# Patient Record
Sex: Female | Born: 1945 | Race: White | Hispanic: No | Smoking: Former smoker
Health system: Southern US, Community
[De-identification: ages and names within clinical notes are randomized; demographics above are authoritative.]

## PROBLEM LIST (undated history)

## (undated) DIAGNOSIS — E785 Hyperlipidemia, unspecified: Secondary | ICD-10-CM

## (undated) DIAGNOSIS — I350 Nonrheumatic aortic (valve) stenosis: Secondary | ICD-10-CM

## (undated) DIAGNOSIS — J302 Other seasonal allergic rhinitis: Secondary | ICD-10-CM

## (undated) DIAGNOSIS — R42 Dizziness and giddiness: Secondary | ICD-10-CM

## (undated) DIAGNOSIS — S46219A Strain of muscle, fascia and tendon of other parts of biceps, unspecified arm, initial encounter: Secondary | ICD-10-CM

## (undated) DIAGNOSIS — E215 Disorder of parathyroid gland, unspecified: Secondary | ICD-10-CM

## (undated) DIAGNOSIS — I10 Essential (primary) hypertension: Secondary | ICD-10-CM

## (undated) HISTORY — PX: ABDOMINAL SURGERY: SHX537

## (undated) HISTORY — DX: Essential (primary) hypertension: I10

## (undated) HISTORY — DX: Strain of muscle, fascia and tendon of other parts of biceps, unspecified arm, initial encounter: S46.219A

## (undated) HISTORY — DX: Hyperlipidemia, unspecified: E78.5

## (undated) HISTORY — DX: Other seasonal allergic rhinitis: J30.2

## (undated) HISTORY — DX: Dizziness and giddiness: R42

## (undated) HISTORY — PX: BREAST BIOPSY: SHX20

---

## 1975-10-25 HISTORY — PX: ABDOMINAL HYSTERECTOMY: SHX81

## 1997-10-24 HISTORY — PX: CHOLECYSTECTOMY: SHX55

## 1999-05-02 ENCOUNTER — Inpatient Hospital Stay (HOSPITAL_COMMUNITY): Admission: EM | Admit: 1999-05-02 | Discharge: 1999-05-05 | Payer: Self-pay | Admitting: Emergency Medicine

## 1999-05-02 ENCOUNTER — Encounter: Payer: Self-pay | Admitting: General Surgery

## 1999-05-02 ENCOUNTER — Encounter: Payer: Self-pay | Admitting: Emergency Medicine

## 1999-05-05 ENCOUNTER — Encounter: Payer: Self-pay | Admitting: General Surgery

## 2005-05-30 ENCOUNTER — Other Ambulatory Visit: Admission: RE | Admit: 2005-05-30 | Discharge: 2005-05-30 | Payer: Self-pay | Admitting: Family Medicine

## 2005-06-06 ENCOUNTER — Ambulatory Visit (HOSPITAL_COMMUNITY): Admission: RE | Admit: 2005-06-06 | Discharge: 2005-06-06 | Payer: Self-pay | Admitting: Surgery

## 2005-06-06 ENCOUNTER — Ambulatory Visit (HOSPITAL_BASED_OUTPATIENT_CLINIC_OR_DEPARTMENT_OTHER): Admission: RE | Admit: 2005-06-06 | Discharge: 2005-06-06 | Payer: Self-pay | Admitting: Surgery

## 2005-06-06 ENCOUNTER — Encounter (INDEPENDENT_AMBULATORY_CARE_PROVIDER_SITE_OTHER): Payer: Self-pay | Admitting: Specialist

## 2007-01-19 ENCOUNTER — Ambulatory Visit (HOSPITAL_BASED_OUTPATIENT_CLINIC_OR_DEPARTMENT_OTHER): Admission: RE | Admit: 2007-01-19 | Discharge: 2007-01-19 | Payer: Self-pay | Admitting: Orthopaedic Surgery

## 2007-10-25 HISTORY — PX: REPLACEMENT TOTAL KNEE: SUR1224

## 2008-05-07 ENCOUNTER — Inpatient Hospital Stay (HOSPITAL_COMMUNITY): Admission: RE | Admit: 2008-05-07 | Discharge: 2008-05-12 | Payer: Self-pay | Admitting: Orthopedic Surgery

## 2008-09-24 ENCOUNTER — Ambulatory Visit (HOSPITAL_BASED_OUTPATIENT_CLINIC_OR_DEPARTMENT_OTHER): Admission: RE | Admit: 2008-09-24 | Discharge: 2008-09-24 | Payer: Self-pay | Admitting: Orthopedic Surgery

## 2008-12-04 IMAGING — CR DG CHEST 2V
2 series · 2 of 2 positions shown · non-contrast
Comparison: None

CLINICAL DATA: Preop.

CHEST - 2 VIEW

[view not recorded (1 of 2)]
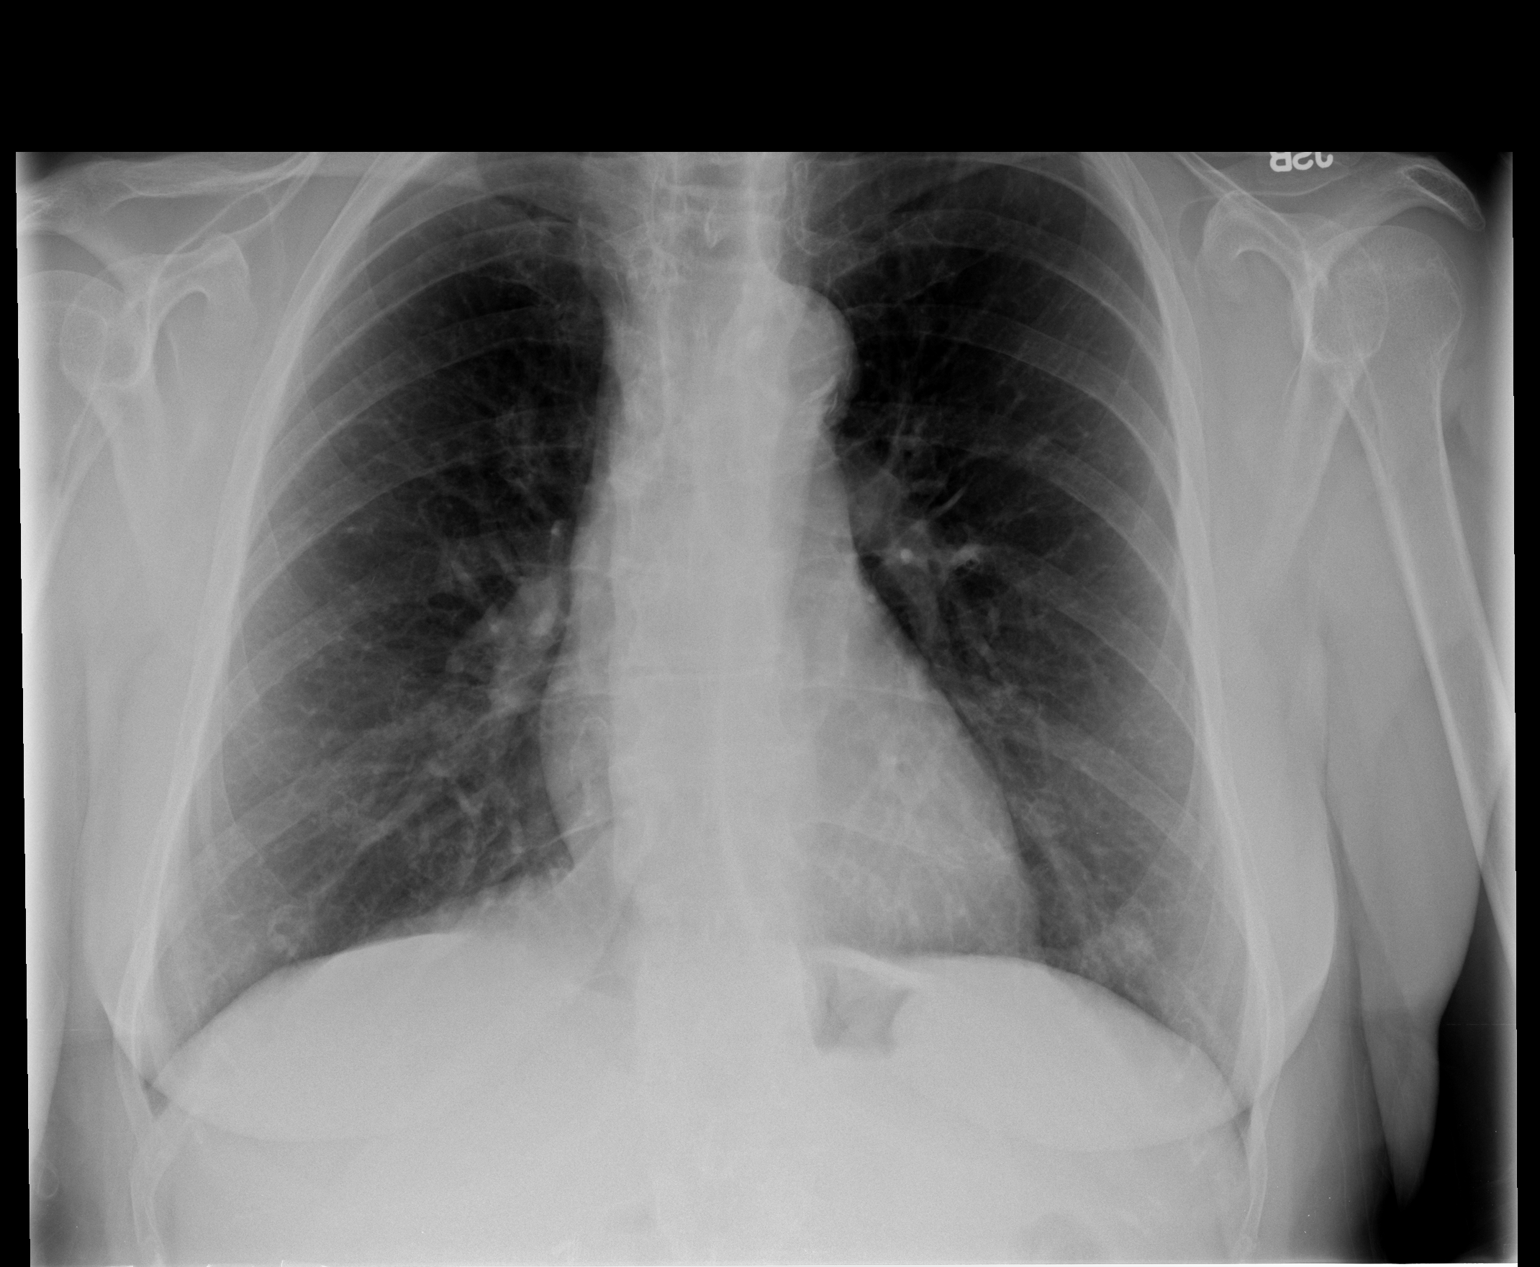

[view not recorded (2 of 2)]
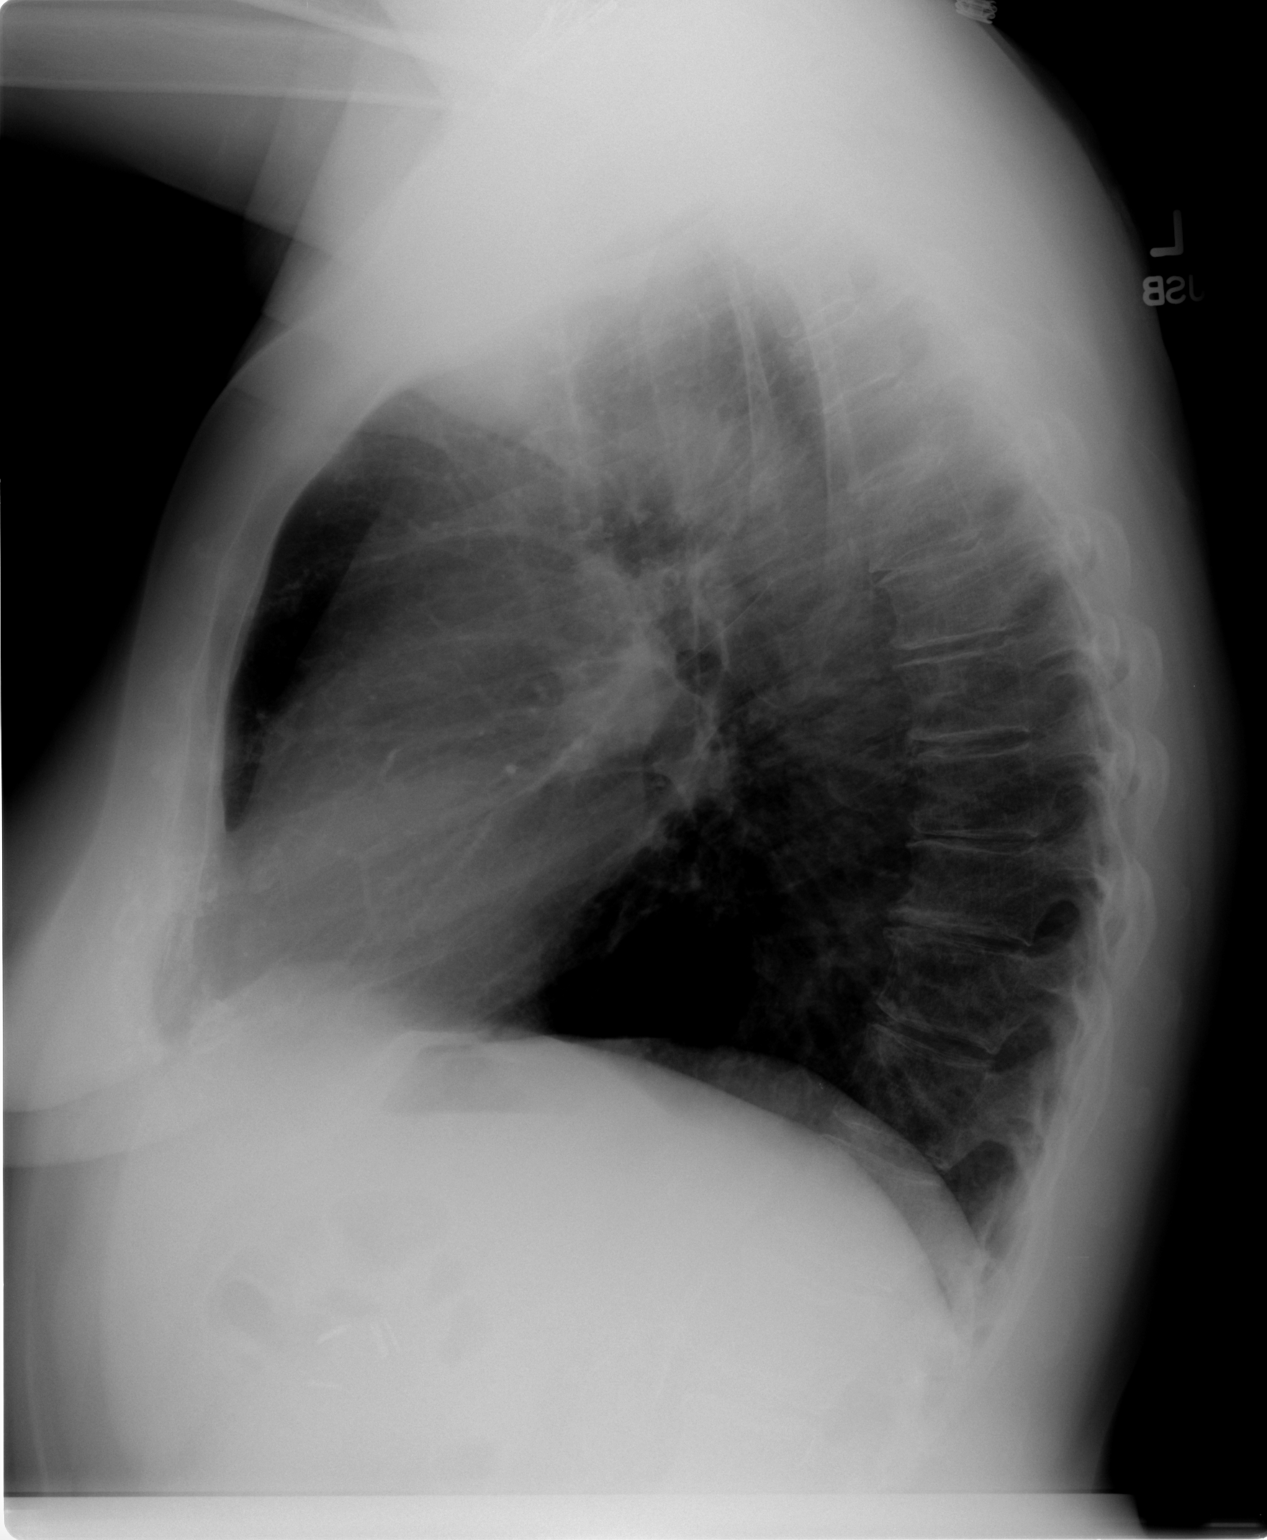

[2 of 2 positions shown; findings below may reference images not displayed]

FINDINGS: Trachea is midline.  Heart size normal.  Thoracic aorta
is calcified.  Probable summation shadow and/or nipple shadow in
the lower left hemithorax.  Lungs otherwise clear.  No pleural
fluid.  Degenerative changes are seen in the spine.
IMPRESSION: No acute findings.

## 2011-03-08 NOTE — Op Note (Signed)
NAME:  Margaret [name], Margaret [name]              ACCOUNT NO.:  [id number]   MEDICAL RECORD NO.:  [id number]          PATIENT TYPE:  AMB   LOCATION:  DSC                          FACILITY:  [facility]   PHYSICIAN:  Harvie Junior, M.D.   DATE OF BIRTH:  02-Oct-1946   DATE OF PROCEDURE:  09/24/2008  DATE OF DISCHARGE:                               OPERATIVE REPORT   PREOPERATIVE DIAGNOSES:  Painful left shoulder with impingement and  acromioclavicular joint arthritis.   POSTOPERATIVE DIAGNOSES:  1. Subscapularis tendon tear.  2. Ruptured biceps tendon.  3. Impingement.  4. Acromioclavicular joint arthritis.   PRINCIPAL PROCEDURES:  1. Mini-open rotator cuff repair of the subscapularis with a 5.5      anchor anteriorly.  2. Arthroscopic subacromial decompression.  3. Arthroscopic distal clavicle resection of 18 mm.  4. Open biceps tenodesis.  5. Debridement of the stump of the biceps tendon within the      glenohumeral joint.   SURGEON:  Harvie Junior, MD.   ASSISTANT:  None.   ANESTHESIA:  General.   BRIEF HISTORY:  Margaret [name] is a 65 year old female with a long history of  having had significant painful left shoulder.  She had been treated for  about a year.  She had injections which did help, but her pain was just  persistent.  She was brought to the operating room for fixation of this  procedure.   PROCEDURE:  The patient was brought to the operating room.  After  adequate anesthesia was obtained with general anesthetic, the patient  placed in the supine position and then he was moved to the beach chair  position.  All bony prominences were well padded.  A preoperative  interscalene block had been performed at this point.  After routine prep  and drape, arthroscopic examination of the shoulder revealed there was  an obvious biceps tendon rupture and the stump was just flapping free in  the joint.  It was about probably 20 mm in the joint.  We used a  debrider and debrided this down.   Once we debrided this down back to the  superior labrum, we looked anteriorly, and unfortunately the subscap  tendon had ruptured and it had been retracted back and this was just  anterior to the pole of the biceps tendon and the interval there.  Once  this was evaluated, the supraspintaus and infraspinatus were evaluated  and felt to have some partial thickness tearing.  This was debrided  undersurface, but no full thickness tearing.  Attention was turned out  to the glenohumeral joint in the subacromial space where an  anterolateral acromioplasty was performed, distal clavicle resection was  performed, and the rotator cuff was evaluated from the top side.  The  fray and tear of the subscap was identified from the top side and at  this point, the arthroscopic portion of the case was abandoned and a  small incision was made over the anterior portal slightly anterior to  where we typically go, so we could get to the subscap dissected down to  the biceps tendon interval, made  a small rend in the ligament holding  the biceps tendon in place and put a tagging stitch on the biceps.  We  then went anterior to the biceps and got the subscap, we found the torn  area, debrided the bone thoroughly with a rongeur and once this was  fashioned up well a 5.5 anchor was placed.  Stitches were placed in the  anterior leading edges of the subscap and that was tacked down the bone  in this area.  Attention was then turned to the biceps tendon where a  curette was used to roughen up the bicipital groove and then following  this a 5.5 anchor was placed in the bicipital groove and with the arm in  extension the biceps tendon was locked in place with the sutures #2  FiberWire, they were on the 5.5 corkscrew anchor.  Once the biceps  tenodesis was performed, we then evaluated.  The acromioplasty was  excellently performed.  At this point, the deltoid rend was closed with  a 1 Vicryl running, skin with 0 and 2-0  Vicryl and 3-0 Monocryl  subcuticular stitch.  Benzoin and Steri-Strips were applied.  The  patient was taken to the recovery room and was noted to be in  satisfactory condition.  Estimated blood loss for procedure was less  than 25 mL.      Harvie Junior, M.D.  Electronically Signed     JLG/MEDQ  D:  09/24/2008  T:  09/24/2008  Job:  [id number]

## 2011-03-08 NOTE — Op Note (Signed)
NAME:  RUARI, DUGGAN NO.:  [id number]   MEDICAL RECORD NO.:  [id number]          PATIENT TYPE:  INP   LOCATION:  5003                         FACILITY:  [facility]   PHYSICIAN:  Harvie Junior, M.D.   DATE OF BIRTH:  1946-01-20   DATE OF PROCEDURE:  05/07/2008  DATE OF DISCHARGE:                               OPERATIVE REPORT   PREOPERATIVE DIAGNOSIS:  End-stage degenerative joint disease bilateral  knees.   POSTOPERATIVE DIAGNOSIS:  End-stage degenerative joint disease bilateral  knees.   PROCEDURES:  1. Right total knee replacement with a Sigma System size 4 narrow      femur, size 3 tibia, 38-mm all poly patella and a 12.5-mm bridging      bearing.   1. Computer-assisted right total knee replacement.   SURGEON:  Harvie Junior, MD   ASSISTANT:  Marshia Ly, PA   ANESTHESIA:  General.   BRIEF HISTORY:  Ms. Ressler is a 65 year old female with a long history of  having severe and significant bilateral knee pain.  We treated  conservatively for a period of time with activity modification,  injection therapy, anti-inflammatory medication, and use of a cane.  She  failed all of these.  Subsequently, plans were made and she was taken to  the operating room for right total knee replacement.  Because of her  young age and longevity, the family felt that computer-assisted  alignment would be appropriate and this was chosen to be used in  preoperatively.   PROCEDURE:  The patient was taken to the operating room and after  anesthesia was obtained with general anesthetic, the patient was placed  supine on the operating table.  The right leg was prepped and draped in  the usual sterile fashion.  Following this, the leg was exsanguinated  and blood pressure tourniquet was inflated to 350 mmHg.  Following this,  a midline incision was made in subcutaneous tissues extending down the  level of the extensor mechanism.  Once this was identified, the medial  and lateral  meniscectomy were performed, as well as anterior and  posterior cruciate excision and osteophyte excision.  Retropatellar fat  pad was excised.  Once this was completed, attention was turned towards  putting into computer assistance modules plus two pins in the tibia and  two pins in the femur and following this, the rays were placed about the  tibia and the femur.  Following this, a registration process was  undertaken at about 20 minutes of the surgical procedure.  The knee was  then put through a range of motion and a long alignment was identified.  Once this was completed, the tibia was then cut perpendicular to its  long axis under computer assisted alignment.  The femur was then cut  perpendicular to the anatomic axis under computer assisted alignment.  Once this was complete, attention was turned towards putting in the  spacer blocks and we could put in the spacer blocks easily once a little  additional tibia was cut again under computer assistance.  Once this was  completed, attention was turned towards sizing  the tibia.  We then sized  with a old school sizing guide and rotated appropriately.  Once this was  done, the tibial cutting block was used.  Once this was completed,  attention was turned towards the anterior and posterior cuts, as well as  the chamfer cuts and once this was completed, attention turned to the  box cut which was completed.  Attention turned to the tibia which was  sized to a 3 and it was drilled and keeled.  Once this was completed,  attention was turned towards the medial lateral side where the 12.5 poly  was put in place and computer assistance was then used perfect neutral  long alignment and perfect gap balance in both flexion and extension.  Attention then turned to the patella which was sized and then cut back  to a level of 13 and then a 38.  The lug holes were drilled for 38 all  poly patella.  These trial components were then placed initially a  size  4 femur and then we went down to the four narrow size 3 tibia, 12.5 mm  bridging-bearing and a 38-mm all poly trial and the excellent neutral  long alignment gap balances were achieved.  The trial components were  removed at this point and the final components were then cemented into  place after copious and thorough irrigation of the knee joint.  Once  this was completed, the attention was turned towards the computer  alignment.  Once again after the trial components were cemented into  place perfect neutral long alignment, the cement was allowed to harden.  The tourniquet was then let down.  All bleeders controlled with  electrocautery and the final poly was then put in place and checked  again under computer assistance.  Computer assistance was then remove at  this point and the final alignment was checked, range of motion was  checked and stability.  All excellent at this point and the medial  parapatellar arthrotomy was closed with 1 Vicryl running over a medium  Hemovac drain.  The skin was closed with 0 and 2-0 Vicryl and 3-0  Monocryl subcuticular stitching.  Benzoin and Steri-Strips were applied.  Sterile compressive dressing was applied.  The patient taken to the  recovery room.  She was noted to be in satisfactory condition.   ESTIMATED BLOOD LOSS:  Less than 100 mL.      Harvie Junior, M.D.  Electronically Signed     JLG/MEDQ  D:  05/07/2008  T:  05/08/2008  Job:  [id number]

## 2011-03-11 NOTE — Op Note (Signed)
NAME:  Margaret [name], Margaret [name]              ACCOUNT NO.:  [id number]   MEDICAL RECORD NO.:  [id number]          PATIENT TYPE:  AMB   LOCATION:  N[facility]C                         [facility]   PHYSICIAN:  Thornton Park. Daphine Deutscher, MD  DATE OF BIRTH:  1946-05-22   DATE OF PROCEDURE:  06/06/2005  DATE OF DISCHARGE:                                 OPERATIVE REPORT   PREOPERATIVE DIAGNOSES:  Mass of back.   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   SURGEON:  Luretha Murphy, M.D.   ANESTHESIA:  MAC.   DESCRIPTION OF PROCEDURE:  Ms. Ausley was given MAC anesthesia and placed in  the left lateral decubitus position. I preoperatively marked the area. I  infiltrated with 1% lidocaine and made a transverse incision through the  skin and were upon I found it encased in some connective tissue. I went  ahead and cut down through this and was able to tease this away from its  adhesions and removed it in toto. The Bovie was used to cauterize the base.  I then sent this for permanent sections. I closed the wound in layers with  the deepest layer taking purchases of the fascia __________ tack the skin  back down to the fascia and to obliterate the dead space. 4-0 Vicryl  subcuticular was used with Benzoin and Steri-Strips to complete the closure.  The patient will be offered some Vicodin to take for pain and be followed in  the office in approximately three weeks.      Thornton Park Daphine Deutscher, MD  Electronically Signed     MBM/MEDQ  D:  06/06/2005  T:  06/06/2005  Job:  [id number]

## 2011-03-11 NOTE — Discharge Summary (Signed)
NAME:  Margaret, [name] NO.:  [id number]   MEDICAL RECORD NO.:  [id number]          PATIENT TYPE:  INP   LOCATION:  5003                         FACILITY:  [facility]   PHYSICIAN:  Harvie Junior, M.D.   DATE OF BIRTH:  02/13/1946   DATE OF ADMISSION:  05/07/2008  DATE OF DISCHARGE:  05/12/2008                               DISCHARGE SUMMARY   ADMITTING DIAGNOSES:  1. End-stage degenerative joint disease, right knee.  2. Hypertension.  3. Anxiety.   DISCHARGE DIAGNOSES:  1. End-stage degenerative joint disease, right knee.  2. Hypertension.  3. Anxiety.  4. Postoperative acute blood loss anemia.   PROCEDURE IN HOSPITAL:  Right total knee arthroplasty, computer-assisted  Harvie Junior, MD on May 07, 2008.   BRIEF HISTORY:  Margaret [name] is a 65 year old female with a long history of  right knee pain.  X-rays showed that she has bone-on-bone degenerative  arthritis.  She had night pain with ambulation and no relief with  conservative treatment including use of medication, modification of  activity, and use of cortisone injections.  Based upon clinical and  radiographic findings, she is felt to be a candidate for a right total  knee arthroplasty and is admitted for this.   PERTINENT LABORATORY STUDIES:  1. EKG on admission showed normal sinus rhythm, normal EKG.      Hemoglobin on admission was 13.5 and hematocrit 39.1.  Indices      within normal limits.  On postoperative day #1, her hemoglobin was      9.8.  On postoperative day #2, 9.7.  On postoperative day #3, 8.9.      Protime on admission was 12.4 seconds and INR of 0.9, and PTT of      33.  Protime on the date of discharge was 16.6 seconds and INR of      1.3 on Coumadin therapy.  CMET on admission showed no abnormalities      and BMET on postoperative day #1 was essentially normal.      Urinalysis with prior to admission was negative for abnormalities.   HOSPITAL COURSE:  The patient underwent right total  knee arthroplasty as  well described Dr. Luiz Blare' operative note, computer-assisted.  Preoperatively, she was given IV gentamicin as well as IV Ancef.  This  was resumed without being Ancef for 24 hours postoperatively.  She was  started on Coumadin antithrombotic therapy per pharmacy protocol for DVT  prophylaxis x1 month and physical therapy was ordered for walker  ambulation and weightbearing as tolerated.  CPM machine was used for  range of motion.  IV fluids were used along with the PCA morphine pump.  On postoperative day #1, she had no significant complaints of right knee  pain and she was awake and alert.  Her hemoglobin was stable and 9.8.  BMET was within normal limits.  She got out of bed with physical  therapy.  A CPM machine was used for range of motion of the knee.  The  patient was noted to live alone as she had recently lost her husband and  she was  felt to possibly be a candidate for skilled nursing facility  with these issues.  On postoperative day #2, she had some nausea and was  relieved with Zofran.  She was voiding without difficulty and she was  here for physical therapy.  Vital signs were stable.  She was afebrile.  Hemoglobin was 9.7.  She was on oral iron for this.  Her dressing was  changed.  Her Hemovac drain was pulled.  Her IV was converted to a  saline lock.  She was felt to be a candidate for a skilled nursing  facility in the next few days.  Apparently, we were unable to get this  arranged.  She had some insurance issues to deal with as well.  On  postoperative day #3, she was somewhat slow in progress with PT but  managing okay.  Initially, we were going to get her to [facility] but this essentially did not work out.  On postoperative  day #4, she was making progress by rest well and was quite restless.  Her right knee wound was benign.  Her calf was soft.  On postoperative  day #5, she was overall doing well and she did not make  arrangements.  She had a skilled nursing facility and she had met her physical therapy  goals.  Therefore, she was discharged home.  She was taking fluids  without any difficulty.  Her vital signs were stable.  She is afebrile.  Right knee wound was benign.  Her calf was soft and nontender.  Her INR  was 1.3.  She was discharged home in improved condition and was on a  regular diet.  She has been on Percocet 5 mg for pain, Coumadin x1 month  postop DVT prophylaxis, and Ambien 10 mg 1 at bedtime p.r.n. sleep.  She  will ambulate without weightbearing as tolerated on the right with a  walker.  She will need home health physical therapy and home health RN  for protimes and Coumadin management x1 month.  Postop home CPM for knee  range of motion.  She is to follow up Dr. Luiz Blare in his office in 10  days, sooner if any problems occur.      Marshia Ly, P.A.      Harvie Junior, M.D.  Electronically Signed    JB/MEDQ  D:  07/02/2008  T:  07/03/2008  Job:  [id number]   cc:   Molly Maduro L. Foy [place], M.D.

## 2011-03-11 NOTE — Op Note (Signed)
NAME:  Margaret [name], Margaret [name] NO.:  [id number]   MEDICAL RECORD NO.:  [id number]          PATIENT TYPE:  AMB   LOCATION:  DSC                          FACILITY:  [facility]   PHYSICIAN:  Sharolyn Douglas, M.D.        DATE OF BIRTH:  1946/01/13   DATE OF PROCEDURE:  01/19/2007  DATE OF DISCHARGE:                               OPERATIVE REPORT   DIAGNOSES:  1. Lumbar degenerative scoliosis.  2. Lumbar spondylosis.  3. L5-S1 spondylolisthesis.   PROCEDURES:  1. Bilateral L5-S1 facette joint injections.  2. Right L5-S1 transforaminal epidural steroid injection.  3. Fluoroscopic imaging used for needle placement of the above      injections.   SURGEON:  Sharolyn Douglas, MD   ASSISTANT:  None.   ANESTHESIA:  MAC plus local.   COMPLICATIONS:  None.   ESTIMATED BLOOD LOSS:  None.   INDICATIONS:  The patient is a pleasant 65 year old female with  persistent back and right lower extremity pain thought to be secondary  to lumbar spondylosis and spondylolisthesis.  She now presents for  diagnostic, potentially therapeutic injections as mentioned above.   PROCEDURE:  After informed consent she was taken to the operating room.  She was turned prone.  She underwent sedation by anesthesia.  Back  prepped and draped in the usual sterile fashion.  Fluoroscopy brought in  the field and the L5-S1 facette joints were visualized.  Twenty-two  gauge Quincke spinal needles were advanced into the facette joints  bilaterally using fluoroscopy.  Preservative-free lidocaine 1% 1 mL and  40 mg of Depo-Medrol were injected into each facette joint bilaterally.   We then turned our attention to performing a right L5-S1 transforaminal  epidural steroid injection.  Using oblique imaging a 22-gauge Quincke  spinal needle was advanced underneath the right L5 pedicle.  Aspiration  showed no blood or CSF.  Omnipaque 1 mL injected, which showed epidural  spread.  We then injected a solution of 80 mg  Depo-Medrol and 4 mL 1% preservative-free lidocaine.  The patient  tolerated the procedure well.  There were no apparent complication.  She  was transferred to recovery in stable condition neurologically intact.  I reviewed post injection instructions with her and her husband, and she  will follow-up in 2 weeks.      Sharolyn Douglas, M.D.  Electronically Signed     MC/MEDQ  D:  01/19/2007  T:  01/19/2007  Job:  [id number]

## 2011-07-21 LAB — CBC
HCT: 39.1
MCV: 87.6
Platelets: 310
RBC: 4.46
WBC: 6.3

## 2011-07-21 LAB — COMPREHENSIVE METABOLIC PANEL
AST: 16
Albumin: 4
Alkaline Phosphatase: 76
BUN: 17
CO2: 30
Chloride: 102
Creatinine, Ser: 0.81
GFR calc Af Amer: >60
GFR calc non Af Amer: >60
Potassium: 3.8
Total Bilirubin: 0.8

## 2011-07-21 LAB — TYPE AND SCREEN

## 2011-07-21 LAB — URINALYSIS, ROUTINE W REFLEX MICROSCOPIC
Bilirubin Urine: NEGATIVE
Glucose, UA: NEGATIVE
Hgb urine dipstick: NEGATIVE
Ketones, ur: NEGATIVE
Protein, ur: NEGATIVE
pH: 6.5

## 2011-07-21 LAB — DIFFERENTIAL
Basophils Absolute: 0
Basophils Relative: 0
Eosinophils Relative: 2
Lymphocytes Relative: 27
Monocytes Absolute: 0.5

## 2011-07-21 LAB — ABO/RH: ABO/RH(D): O NEG

## 2011-07-21 LAB — PROTIME-INR: Prothrombin Time: 12.4

## 2011-07-22 LAB — PROTIME-INR
INR: 1.2
INR: 1.3
Prothrombin Time: 16.3 — ABNORMAL HIGH
Prothrombin Time: 16.4 — ABNORMAL HIGH
Prothrombin Time: 16.6 — ABNORMAL HIGH

## 2011-07-22 LAB — CBC
HCT: 25.7 — ABNORMAL LOW
HCT: 28 — ABNORMAL LOW
Hemoglobin: 8.9 — ABNORMAL LOW
Hemoglobin: 9.7 — ABNORMAL LOW
Hemoglobin: 9.8 — ABNORMAL LOW
MCHC: 34.7
MCV: 87.5
Platelets: 276
RBC: 2.92 — ABNORMAL LOW
RBC: 3.1 — ABNORMAL LOW
RBC: 3.19 — ABNORMAL LOW
WBC: 8.3
WBC: 8.8

## 2011-07-22 LAB — BASIC METABOLIC PANEL
BUN: 12
Chloride: 93 — ABNORMAL LOW
GFR calc Af Amer: >60
GFR calc non Af Amer: >60
Potassium: 4.1
Sodium: 131 — ABNORMAL LOW

## 2011-07-26 LAB — BASIC METABOLIC PANEL
BUN: 13 mg/dL (ref 6–23)
Creatinine, Ser: 0.67 mg/dL (ref 0.4–1.2)
GFR calc non Af Amer: >60 mL/min (ref >60)

## 2011-07-29 LAB — POCT HEMOGLOBIN-HEMACUE: Hemoglobin: 12 g/dL (ref 12.0–15.0)

## 2012-09-11 ENCOUNTER — Other Ambulatory Visit: Payer: Self-pay | Admitting: Dermatology

## 2014-09-03 ENCOUNTER — Encounter: Payer: Self-pay | Admitting: Physician Assistant

## 2014-09-12 ENCOUNTER — Encounter: Payer: Self-pay | Admitting: Physician Assistant

## 2014-09-12 ENCOUNTER — Other Ambulatory Visit (INDEPENDENT_AMBULATORY_CARE_PROVIDER_SITE_OTHER): Payer: Medicare PPO

## 2014-09-12 ENCOUNTER — Ambulatory Visit (INDEPENDENT_AMBULATORY_CARE_PROVIDER_SITE_OTHER): Payer: Medicare PPO | Admitting: Physician Assistant

## 2014-09-12 VITALS — BP 140/90 | HR 70 | Ht 67.0 in | Wt 197.0 lb

## 2014-09-12 DIAGNOSIS — Z800 Family history of malignant neoplasm of digestive organs: Secondary | ICD-10-CM

## 2014-09-12 DIAGNOSIS — D509 Iron deficiency anemia, unspecified: Secondary | ICD-10-CM | POA: Diagnosis not present

## 2014-09-12 LAB — IBC PANEL
Iron: 295 ug/dL — ABNORMAL HIGH (ref 42–145)
SATURATION RATIOS: 57.9 % — AB (ref 20.0–50.0)
TRANSFERRIN: 364 mg/dL — AB (ref 212.0–360.0)

## 2014-09-12 MED ORDER — POLYETHYLENE GLYCOL 3350 17 GM/SCOOP PO POWD: ORAL | Status: DC

## 2014-09-12 NOTE — Progress Notes (Signed)
Patient ID: Margaret [name], female   DOB: 06-04-1946, 68 y.o.   MRN: [id number]   Subjective:    Patient ID: Margaret [name], female    DOB: 1946/09/01, 68 y.o.   MRN: [id number]  HPI Margaret [name] is a pleasant 68 year old white female known to GI today referred by Dr.Pang for evaluation of new finding of microcytic anemia. Patient has been generally healthy, she states she did have a screening colonoscopy done in [place] in 1998 and was told that was negative. She has not had follow-up since. She does have a sister age 39 who was just diagnosed with colon cancer recently. Patient has no current GI symptoms, denies any problems with abdominal pain and changes in appetite and nausea dysphagia odynophagia or change in bowel habits melena or hematochezia. She has no previous history of anemia that she is aware of it says that her mother died from Felty's syndrome and had chronic problems with anemia. Patient has just started on fusion plus for iron supplementation, she says she did Hemoccults which she just mail back in to Dr. Wilmon Pali office yesterday  Review of Systems Pertinent positive and negative review of systems were noted in the above HPI section.  All other review of systems was otherwise negative.  Outpatient Encounter Prescriptions as of 09/12/2014  Medication Sig  . clonazePAM (KLONOPIN) 0.5 MG tablet Take 0.5 mg by mouth 2 (two) times daily as needed for anxiety.  . polyethylene glycol powder (GLYCOLAX/MIRALAX) powder Take as directed for colonoscopy prep.  . [DISCONTINUED] aspirin 81 MG tablet Take 81 mg by mouth daily.  . [DISCONTINUED] Cholecalciferol (VITAMIN D-3) 1000 UNITS CAPS Take 2 capsules by mouth daily.  . [DISCONTINUED] Chromium 400 MCG TABS Take 1 tablet by mouth daily.  . [DISCONTINUED] valsartan-hydrochlorothiazide (DIOVAN-HCT) 160-12.5 MG per tablet Take 0.5 tablets by mouth daily.  . [DISCONTINUED] vitamin B-12 (CYANOCOBALAMIN) 1000 MCG tablet Take 1,000 mcg by mouth daily.     Allergies  Allergen Reactions  . Augmentin [Amoxicillin-Pot Clavulanate] Hives  . Fentanyl    Patient Active Problem List   Diagnosis Date Noted  . Anemia, iron deficiency 09/12/2014   History   Social History  . Marital Status: Widowed    Spouse Name: N/A    Number of Children: N/A  . Years of Education: N/A   Occupational History  . retired    Social History Main Topics  . Smoking status: Former Research scientist (life sciences)  . Smokeless tobacco: Never Used  . Alcohol Use: No  . Drug Use: No  . Sexual Activity: Not on file   Other Topics Concern  . Not on file   Social History Narrative    Margaret [name] family history includes Brain cancer in her sister; Breast cancer in her sister; Cirrhosis in her father; Colon cancer in her sister.      Objective:    Filed Vitals:   09/12/14 1000  BP: 140/90  Pulse: 70    Physical Exam well-developed elderly white female in no acute distress, pleasant height 5 foot 7 weight 197, BMI 30. HEENT; nontraumatic normocephalic EOMI PERRLA sclerae anicteric, Neck; supple no JVD, Cardiovascular ;regular rate and rhythm with S1-S2 no murmur or gallop, Pulm; clear bilaterally, Abdomen ;soft nontender nondistended bowel sounds are active is no palpable mass or hepatosplenomegaly, Rectal ;exam not done patient has just completed Hemoccults which are pending, 70 no clubbing cyanosis or edema skin warm and dry, Psych; mood and affect appropriate     Assessment & Plan:   #36 68 year old  female asymptomatic with new finding of microcytic anemia, Hemoccults are pending. Patient last had colonoscopy in 1998 which was negative, rule out occult lower or upper GI lesion #2 positive family history of colon cancer in patient's sister age 79  Plan; check iron studies today Patient to continue fusion plus for at least 3 months and then will need repeat iron studies and CBC Will schedule for colonoscopy with Dr. Carlean Purl, discussed possible need for EGD if colonoscopy is  negative. Patient will pursue colonoscopy first. Procedure discussed in detail with the patient and she is agreeable to proceed.   Margaret Thane Genia Harold PA-C 09/12/2014

## 2014-09-12 NOTE — Patient Instructions (Signed)
Please go to the basement level to have your labs drawn.  You have been scheduled for a colonoscopy. Please follow written instructions given to you at your visit today.  Please pick up your prep kit at the pharmacy within the next 1-3 days. Generic miralax. [facility]. If you use inhalers (even only as needed), please bring them with you on the day of your procedure. Your physician has requested that you go to www.startemmi.com and enter the access code given to you at your visit today. This web site gives a general overview about your procedure. However, you should still follow specific instructions given to you by our office regarding your preparation for the procedure.

## 2014-09-15 ENCOUNTER — Telehealth: Payer: Self-pay

## 2014-09-15 NOTE — Telephone Encounter (Signed)
She was sent to [place] for anemia- it was assumed fe deficient but iron studies had not been done-that is why I did them. Yes we can repeat a cbc to recheck hgb. Also please call Dr. Dimas Millin office and get results of hemocults or hemosure whichever they did

## 2014-09-15 NOTE — Telephone Encounter (Signed)
Call back from the patient. I had previously left her a message.This is the patient with elevated iron studies. She has an EGD scheduled for 09/24/14. She was sent to [place] by PCP for low iron. She reports she never started any iron supplement. She is asking if the CBC will be rechecked? Iron 42 - 145 ug/dL 295 (H)   Transferrin 212.0 - 360.0 mg/dL 364.0 (H)   Saturation Ratios 20.0 - 50.0 % 57.9 (H)

## 2014-09-16 NOTE — Telephone Encounter (Signed)
Patient declines to come back in for CBC. She was told by Dr Minna Antis the hemoccult was negative. Called to [facility] and left a message for medical records, request a copy of this and her recent labs.

## 2014-09-22 NOTE — Progress Notes (Signed)
Agree with Ms. Esterwood's assessment and plan. Chassity Ludke E. Abdinasir Spadafore, MD, FACG   

## 2014-09-24 ENCOUNTER — Encounter: Payer: Self-pay | Admitting: Internal Medicine

## 2014-09-24 ENCOUNTER — Ambulatory Visit (AMBULATORY_SURGERY_CENTER): Payer: Medicare PPO | Admitting: Internal Medicine

## 2014-09-24 VITALS — BP 131/82 | HR 74 | Temp 97.3°F | Resp 20 | Ht 67.0 in | Wt 197.0 lb

## 2014-09-24 DIAGNOSIS — K635 Polyp of colon: Secondary | ICD-10-CM

## 2014-09-24 DIAGNOSIS — Z800 Family history of malignant neoplasm of digestive organs: Secondary | ICD-10-CM | POA: Insufficient documentation

## 2014-09-24 DIAGNOSIS — D509 Iron deficiency anemia, unspecified: Secondary | ICD-10-CM

## 2014-09-24 DIAGNOSIS — D125 Benign neoplasm of sigmoid colon: Secondary | ICD-10-CM

## 2014-09-24 DIAGNOSIS — D124 Benign neoplasm of descending colon: Secondary | ICD-10-CM

## 2014-09-24 MED ORDER — SODIUM CHLORIDE 0.9 % IV SOLN: 500.0000 mL | INTRAVENOUS | Status: DC

## 2014-09-24 NOTE — Patient Instructions (Addendum)
I found and removed 3 polyps that look benign. I also saw diverticulosis. Diverticulosis is common and not usually a problem. Please read the handout provided.  I do not think you need further testing from me now but you should return to Dr. Minna Antis re: anemia.  I will let you know pathology results and when to have another routine colonoscopy by mail.  I appreciate the opportunity to care for you. Margaret Mayer, MD, [facility]  Impressions/recommendations:  Polyps (handout given) Diverticulosis (handout given) Repeat colonoscopy pending pathology results. Follow up anemia with Dr. Minna Antis.  YOU HAD AN ENDOSCOPIC PROCEDURE TODAY AT [facility]: Refer to the procedure report that was given to you for any specific questions about what was found during the examination.  If the procedure report does not answer your questions, please call your gastroenterologist to clarify.  If you requested that your care partner not be given the details of your procedure findings, then the procedure report has been included in a sealed envelope for you to review at your convenience later.  YOU SHOULD EXPECT: Some feelings of bloating in the abdomen. Passage of more gas than usual.  Walking can help get rid of the air that was put into your GI tract during the procedure and reduce the bloating. If you had a lower endoscopy (such as a colonoscopy or flexible sigmoidoscopy) you may notice spotting of blood in your stool or on the toilet paper. If you underwent a bowel prep for your procedure, then you may not have a normal bowel movement for a few days.  DIET: Your first meal following the procedure should be a light meal and then it is ok to progress to your normal diet.  A half-sandwich or bowl of soup is an example of a good first meal.  Heavy or fried foods are harder to digest and may make you feel nauseous or bloated.  Likewise meals heavy in dairy and vegetables can cause extra gas to form and  this can also increase the bloating.  Drink plenty of fluids but you should avoid alcoholic beverages for 24 hours.  ACTIVITY: Your care partner should take you home directly after the procedure.  You should plan to take it easy, moving slowly for the rest of the day.  You can resume normal activity the day after the procedure however you should NOT DRIVE or use heavy machinery for 24 hours (because of the sedation medicines used during the test).    SYMPTOMS TO REPORT IMMEDIATELY: A gastroenterologist can be reached at any hour.  During normal business hours, 8:30 AM to 5:00 PM Monday through Friday, call [phone].  After hours and on weekends, please call the GI answering service at [phone] who will take a message and have the physician on call contact you.   Following lower endoscopy (colonoscopy or flexible sigmoidoscopy):  Excessive amounts of blood in the stool  Significant tenderness or worsening of abdominal pains  Swelling of the abdomen that is new, acute  Fever of 100F or higher  FOLLOW UP: If any biopsies were taken you will be contacted by phone or by letter within the next 1-3 weeks.  Call your gastroenterologist if you have not heard about the biopsies in 3 weeks.  Our staff will call the home number listed on your records the next business day following your procedure to check on you and address any questions or concerns that you may have at that time regarding the  information given to you following your procedure. This is a courtesy call and so if there is no answer at the home number and we have not heard from you through the emergency physician on call, we will assume that you have returned to your regular daily activities without incident.  SIGNATURES/CONFIDENTIALITY: You and/or your care partner have signed paperwork which will be entered into your electronic medical record.  These signatures attest to the fact that that the information above on your After Visit  Summary has been reviewed and is understood.  Full responsibility of the confidentiality of this discharge information lies with you and/or your care-partner.

## 2014-09-24 NOTE — Progress Notes (Signed)
Report to PACU, RN, vss, BBS= Clear.  

## 2014-09-24 NOTE — Op Note (Signed)
[place]  [address] [place], [id number]   COLONOSCOPY PROCEDURE REPORT  PATIENT: Margaret [name], Margaret [name]  MR#: [id number] BIRTHDATE: 1946-01-28 , 66  yrs. old GENDER: female ENDOSCOPIST: Gatha Mayer, MD, [facility] PROCEDURE DATE:  09/24/2014 PROCEDURE:   Colonoscopy with snare polypectomy First Screening Colonoscopy - Avg.  risk and is 50 yrs.  old or older - No.  Prior Negative Screening - Now for repeat screening. 10 or more years since last screening  History of Adenoma - Now for follow-up colonoscopy & has been > or = to 3 yrs.  N/A  Polyps Removed Today? Yes. ASA CLASS:   Class II INDICATIONS:patient's immediate family history of colon cancer and a new microcytic anemia MEDICATIONS: Propofol 230 mg IV and Monitored anesthesia care  DESCRIPTION OF PROCEDURE:   After the risks benefits and alternatives of the procedure were thoroughly explained, informed consent was obtained.  The digital rectal exam revealed no abnormalities of the rectum.   The LB [id number]  endoscope was introduced through the anus and advanced to the cecum, which was identified by both the appendix and ileocecal valve. No adverse events experienced.   The quality of the prep was excellent, using MiraLax  The instrument was then slowly withdrawn as the colon was fully examined.      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  Retroflexed right colon and rectal views revealed no abnormalities. The time to cecum=1 minutes 46 seconds.  Withdrawal time=13 minutes 14 seconds.  The scope was withdrawn and the procedure completed. COMPLICATIONS: There were no immediate complications.     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 2) Iron 295, TIBC 364 and sat 57%, hemocults negative so will not do an egd as this is heme negative anemia and not iron-deficiency 3) She needs to schedukle f/u Dr. Minna Antis re: anemia  eSigned:  Gatha Mayer, MD, [facility] 09/24/2014 11:25 AM   cc: Tommy Medal, MD and The Patient   PATIENT NAME:  Margaret [name] MR#: [id number]

## 2014-09-24 NOTE — Progress Notes (Signed)
Called to room to assist during endoscopic procedure.  Patient ID and intended procedure confirmed with present staff. Received instructions for my participation in the procedure from the performing physician.  

## 2014-09-25 ENCOUNTER — Telehealth: Payer: Self-pay | Admitting: *Deleted

## 2014-09-25 NOTE — Telephone Encounter (Signed)
  Follow up Call-  Call back number 09/24/2014  Post procedure Call Back phone  # [phone]  Permission to leave phone message Yes     Patient questions:  MESSAGE LEFT TO CALL us IF NECESSARY.

## 2014-09-29 ENCOUNTER — Encounter: Payer: Self-pay | Admitting: Internal Medicine

## 2014-09-29 NOTE — Progress Notes (Signed)
Quick Note:  Hyperplastic polyps Repeat colon 2020-21 - FHx CRCA ______

## 2014-09-30 ENCOUNTER — Encounter: Payer: Self-pay | Admitting: Internal Medicine

## 2014-12-11 ENCOUNTER — Other Ambulatory Visit (INDEPENDENT_AMBULATORY_CARE_PROVIDER_SITE_OTHER): Payer: Self-pay | Admitting: Surgery

## 2015-08-09 DIAGNOSIS — R51 Headache: Secondary | ICD-10-CM | POA: Diagnosis not present

## 2015-08-09 DIAGNOSIS — E876 Hypokalemia: Secondary | ICD-10-CM | POA: Diagnosis not present

## 2015-08-09 DIAGNOSIS — I160 Hypertensive urgency: Secondary | ICD-10-CM | POA: Diagnosis not present

## 2015-08-09 DIAGNOSIS — D649 Anemia, unspecified: Secondary | ICD-10-CM | POA: Diagnosis not present

## 2015-08-09 DIAGNOSIS — E871 Hypo-osmolality and hyponatremia: Secondary | ICD-10-CM | POA: Diagnosis not present

## 2015-08-11 DIAGNOSIS — I10 Essential (primary) hypertension: Secondary | ICD-10-CM | POA: Diagnosis not present

## 2015-09-01 DIAGNOSIS — I10 Essential (primary) hypertension: Secondary | ICD-10-CM | POA: Diagnosis not present

## 2015-10-01 DIAGNOSIS — I10 Essential (primary) hypertension: Secondary | ICD-10-CM | POA: Diagnosis not present

## 2015-10-19 DIAGNOSIS — I10 Essential (primary) hypertension: Secondary | ICD-10-CM | POA: Diagnosis not present

## 2015-10-20 DIAGNOSIS — I10 Essential (primary) hypertension: Secondary | ICD-10-CM | POA: Diagnosis not present

## 2015-12-24 DIAGNOSIS — E7800 Pure hypercholesterolemia, unspecified: Secondary | ICD-10-CM | POA: Insufficient documentation

## 2015-12-24 DIAGNOSIS — E785 Hyperlipidemia, unspecified: Secondary | ICD-10-CM | POA: Insufficient documentation

## 2019-11-11 ENCOUNTER — Encounter: Payer: Self-pay | Admitting: Internal Medicine

## 2019-11-12 ENCOUNTER — Telehealth: Payer: Self-pay | Admitting: Internal Medicine

## 2019-11-12 NOTE — Telephone Encounter (Signed)
Spoke with patient and she stated she has moved to [place] and had her colonoscopy done in 2020

## 2020-01-23 DIAGNOSIS — M25551 Pain in right hip: Secondary | ICD-10-CM | POA: Insufficient documentation

## 2020-01-23 DIAGNOSIS — M25562 Pain in left knee: Secondary | ICD-10-CM | POA: Insufficient documentation

## 2020-01-23 DIAGNOSIS — Z01818 Encounter for other preprocedural examination: Secondary | ICD-10-CM | POA: Insufficient documentation

## 2021-02-23 DIAGNOSIS — M1712 Unilateral primary osteoarthritis, left knee: Secondary | ICD-10-CM | POA: Insufficient documentation

## 2021-04-09 DIAGNOSIS — D649 Anemia, unspecified: Secondary | ICD-10-CM | POA: Insufficient documentation

## 2021-04-09 DIAGNOSIS — E785 Hyperlipidemia, unspecified: Secondary | ICD-10-CM | POA: Insufficient documentation

## 2021-04-09 DIAGNOSIS — N2889 Other specified disorders of kidney and ureter: Secondary | ICD-10-CM | POA: Insufficient documentation

## 2021-04-09 DIAGNOSIS — I6523 Occlusion and stenosis of bilateral carotid arteries: Secondary | ICD-10-CM | POA: Insufficient documentation

## 2021-04-14 DIAGNOSIS — I350 Nonrheumatic aortic (valve) stenosis: Secondary | ICD-10-CM | POA: Insufficient documentation

## 2021-11-01 DIAGNOSIS — R0602 Shortness of breath: Secondary | ICD-10-CM | POA: Insufficient documentation

## 2021-11-01 DIAGNOSIS — Z780 Asymptomatic menopausal state: Secondary | ICD-10-CM | POA: Insufficient documentation

## 2021-11-01 DIAGNOSIS — E669 Obesity, unspecified: Secondary | ICD-10-CM | POA: Insufficient documentation

## 2022-01-13 DIAGNOSIS — K449 Diaphragmatic hernia without obstruction or gangrene: Secondary | ICD-10-CM | POA: Insufficient documentation

## 2024-08-26 ENCOUNTER — Ambulatory Visit
Admission: RE | Admit: 2024-08-26 | Discharge: 2024-08-26 | Disposition: A | Discharge status: Home / Self Care | Source: Ambulatory Visit

## 2024-08-26 VITALS — BP 136/77 | HR 74 | Temp 98.5°F | Resp 18

## 2024-08-26 DIAGNOSIS — R5383 Other fatigue: Secondary | ICD-10-CM | POA: Insufficient documentation

## 2024-08-26 DIAGNOSIS — R7302 Impaired glucose tolerance (oral): Secondary | ICD-10-CM | POA: Insufficient documentation

## 2024-08-26 DIAGNOSIS — I10 Essential (primary) hypertension: Secondary | ICD-10-CM | POA: Insufficient documentation

## 2024-08-26 DIAGNOSIS — E663 Overweight: Secondary | ICD-10-CM | POA: Insufficient documentation

## 2024-08-26 DIAGNOSIS — E559 Vitamin D deficiency, unspecified: Secondary | ICD-10-CM | POA: Insufficient documentation

## 2024-08-26 DIAGNOSIS — J309 Allergic rhinitis, unspecified: Secondary | ICD-10-CM | POA: Insufficient documentation

## 2024-08-26 DIAGNOSIS — R002 Palpitations: Secondary | ICD-10-CM | POA: Insufficient documentation

## 2024-08-26 DIAGNOSIS — J302 Other seasonal allergic rhinitis: Secondary | ICD-10-CM | POA: Diagnosis not present

## 2024-08-26 DIAGNOSIS — R0982 Postnasal drip: Secondary | ICD-10-CM

## 2024-08-26 DIAGNOSIS — F411 Generalized anxiety disorder: Secondary | ICD-10-CM | POA: Insufficient documentation

## 2024-08-26 DIAGNOSIS — R799 Abnormal finding of blood chemistry, unspecified: Secondary | ICD-10-CM | POA: Insufficient documentation

## 2024-08-26 DIAGNOSIS — Z0000 Encounter for general adult medical examination without abnormal findings: Secondary | ICD-10-CM | POA: Insufficient documentation

## 2024-08-26 DIAGNOSIS — M8580 Other specified disorders of bone density and structure, unspecified site: Secondary | ICD-10-CM | POA: Insufficient documentation

## 2024-08-26 HISTORY — DX: Disorder of parathyroid gland, unspecified: E21.5

## 2024-08-26 HISTORY — DX: Nonrheumatic aortic (valve) stenosis: I35.0

## 2024-08-26 MED ORDER — MOMETASONE FUROATE 50 MCG/ACT NA SUSP: 2.0000 | Freq: Every day | NASAL | 2 refills | Status: AC

## 2024-08-26 MED ORDER — FEXOFENADINE HCL 180 MG PO TABS
180.0000 mg | ORAL_TABLET | Freq: Every day | ORAL | 1 refills | Status: AC
Start: 2024-08-26 — End: 2025-02-22

## 2024-08-26 MED ORDER — MOMETASONE FUROATE 50 MCG/ACT NA SUSP: 2.0000 | Freq: Every day | NASAL | 2 refills | Status: DC

## 2024-08-26 MED ORDER — DESLORATADINE 5 MG PO TABS: 5.0000 mg | ORAL_TABLET | Freq: Every day | ORAL | 2 refills | Status: DC

## 2024-08-26 MED ORDER — FEXOFENADINE HCL 180 MG PO TABS: 180.0000 mg | ORAL_TABLET | Freq: Every day | ORAL | 1 refills | Status: DC

## 2024-08-26 MED ORDER — DESLORATADINE 5 MG PO TABS: 5.0000 mg | ORAL_TABLET | Freq: Every day | ORAL | 2 refills | Status: AC

## 2024-08-26 NOTE — ED Provider Notes (Signed)
 [facility]    CSN: [id number] Arrival date & time: 08/26/24  1156    HISTORY   Chief Complaint  Patient presents with   Nasal Congestion    Entered by patient   HPI Margaret [name] is a pleasant, 78 y.o. female who presents to urgent care today. Patient complains of a 2-week history of increased nasal congestion, nasal drainage, nonproductive cough which seems to get a little worse at night and fatigue.  Patient states she is also had some mid back discomfort, states she has been trying to rest more than usual thinking she may have a cold or other viral illness.  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  Patient reports having pneumonia a year ago.  Patient states she has been using saline nasal rinses without meaningful relief.  EMR reviewed by me, patient had similar complaints of sinusitis a year ago, was advised to begin Allegra at that time.  Patient states she is not currently taking Allegra or using any other type of allergy medications.  Patient has normal vital signs on arrival today and appears to be in no acute distress.  Patient denies shortness of breath, chest pain, cough productive of sputum, fever, body aches, chills, known sick contacts.  The history is provided by the patient.   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   Obesity 11/01/2021   Postmenopausal 11/01/2021   Shortness of breath 11/01/2021   Nonrheumatic aortic valve stenosis 04/14/2021   Atherosclerosis of both carotid arteries 04/09/2021   Chronic renal impairment, stage 3a 04/09/2021   Dyslipidemia 04/09/2021   Normocytic anemia 04/09/2021   Osteoarthritis of left knee 02/23/2021   Left knee pain 01/23/2020   Pre-op testing 01/23/2020   Right hip pain 01/23/2020   High cholesterol 12/24/2015   Hyperlipidemia 12/24/2015   Family history of colon cancer - sister age 74 at dx 09/24/2014   Anemia, iron deficiency 09/12/2014   Past Surgical History:  Procedure Laterality Date   ABDOMINAL HYSTERECTOMY  10/25/1975   without ovaries   ABDOMINAL SURGERY     BREAST BIOPSY     CHOLECYSTECTOMY  10/24/1997   REPLACEMENT TOTAL KNEE  10/25/2007   right   OB History   No obstetric history on file.    Home Medications    Prior to Admission medications   Medication Sig Start Date End Date Taking? Authorizing Provider  ACCU-CHEK GUIDE TEST test strip 1 each daily. 08/24/24  Yes [provider]  amLODipine (NORVASC) 2.5 MG tablet  12/09/19  Yes [provider]  clindamycin (CLEOCIN) 300 MG capsule Take 2 caps one hour prior to dental appointment 08/19/21  Yes [provider]  desonide (DESOWEN) 0.05 % cream Apply topically. 12/07/23  Yes [provider]  eszopiclone (LUNESTA) 2 MG TABS tablet Take 2 mg by mouth daily. 07/07/24  Yes [provider]  famotidine (PEPCID) 20 MG tablet Take 20 mg by mouth. 06/08/22  Yes [provider]  fexofenadine (ALLEGRA) 180 MG tablet Take 180 mg by mouth. 03/30/23  Yes [provider]  lactulose (CHRONULAC) 10 GM/15ML solution Take 10 g by mouth 2 (two) times daily. 04/09/24  Yes [provider]  metFORMIN (GLUCOPHAGE-XR) 500 MG 24 hr tablet Take 500 mg by mouth daily. 07/02/24  Yes [provider]  metoprolol succinate (TOPROL-XL) 100 MG 24 hr tablet Take 100 mg by mouth. 03/11/21  Yes [provider]  olmesartan (BENICAR) 40 MG tablet Take 40 mg by mouth daily. 06/24/24  Yes [provider]  olmesartan-hydrochlorothiazide (BENICAR HCT) 40-12.5 MG tablet Take 1 tablet by mouth every morning. 11/17/19  Yes [provider]  Vitamin D, Ergocalciferol, (DRISDOL) 1.25 MG (50000 UNIT) CAPS capsule Take 50,000 Units by mouth once a week. 06/07/24  Yes [provider]  alendronate (FOSAMAX) 70 MG tablet Take 70 mg by mouth.    [provider]  amLODipine (NORVASC) 10 MG tablet Take 10 mg by mouth daily.    [provider]  amLODipine (NORVASC) 5 MG tablet amlodipine 5 mg tablet TAKE 1 TABLET BY MOUTH EVERY DAY    [provider]  clonazePAM (KLONOPIN) 0.5 MG tablet Take 0.5 mg by mouth 2 (two) times daily as needed for anxiety.    [provider]  Cyanocobalamin 5000 MCG SUBL Take 5,000 mcg by mouth.    [provider]  Ferrous Fumarate-Folic Acid 324-1 MG TABS Take 1 tablet by mouth daily.    [provider]  furosemide (LASIX) 20 MG tablet Take 20 mg by mouth every morning.    [provider]  hydrochlorothiazide (HYDRODIURIL) 12.5 MG tablet     [provider]  LORazepam (ATIVAN) 1 MG tablet Take 1 mg by mouth daily as needed.    [provider]  mupirocin ointment (BACTROBAN) 2 % Apply topically.    [provider]  olmesartan-hydrochlorothiazide (BENICAR HCT) 40-25 MG tablet Take 1 tablet by mouth every morning.    [provider]  rosuvastatin (CRESTOR) 20 MG tablet Take 20 mg by mouth at bedtime.    [provider]  valsartan-hydrochlorothiazide (DIOVAN-HCT) 160-12.5 MG per tablet  09/14/14   [provider]    Family History Family History  Problem Relation Age of Onset   Cirrhosis Father    Colon cancer Sister    Breast  cancer Sister    Brain cancer Sister    Social History Social History   Tobacco Use   Smoking status: Former    Passive exposure: Never   Smokeless tobacco: Never  Vaping Use   Vaping status: Never Used  Substance Use Topics   Alcohol use: No    Alcohol/week: 0.0 standard drinks of alcohol   Drug use: No   Allergies   Augmentin [amoxicillin-pot clavulanate], Ibuprofen, Other, and Fentanyl  Review of Systems Review of Systems Pertinent findings revealed after performing a 14 point review of systems has been noted in the history of present illness.  Physical Exam Vital Signs BP 136/77 (BP Location: Right Arm)   Pulse 74   Temp 98.5 F (36.9 C) (Oral)   Resp 18   SpO2 95%   No data found.  Physical Exam Vitals and nursing note reviewed.  Constitutional:      General:  She is awake. She is not in acute distress.    Appearance: Normal appearance. She is well-developed and well-groomed. She is not ill-appearing.  HENT:     Head: Normocephalic and atraumatic.     Salivary Glands: Right salivary gland is not diffusely enlarged or tender. Left salivary gland is not diffusely enlarged or tender.     Right Ear: Hearing, ear canal and external ear normal. A middle ear effusion is present. Tympanic membrane is bulging. Tympanic membrane is not injected or erythematous.     Left Ear: Hearing, ear canal and external ear normal. A middle ear effusion is present. Tympanic membrane is bulging. Tympanic membrane is not injected or erythematous.     Ears:     Comments: Bilateral EACs normal, both TMs bulging with clear fluid    Nose: Rhinorrhea present. No nasal deformity, septal deviation, signs of injury or nasal tenderness. Rhinorrhea is clear.     Right Nostril: Occlusion present. No foreign body, epistaxis or septal hematoma.     Left Nostril: Occlusion present. No foreign body, epistaxis or septal hematoma.     Right Turbinates: Enlarged, swollen and pale.     Left Turbinates:  Enlarged, swollen and pale.     Right Sinus: No maxillary sinus tenderness or frontal sinus tenderness.     Left Sinus: No maxillary sinus tenderness or frontal sinus tenderness.     Mouth/Throat:     Lips: Pink. No lesions.     Mouth: Mucous membranes are moist. No oral lesions.     Tongue: No lesions. Tongue does not deviate from midline.     Palate: No mass and lesions.     Pharynx: Oropharynx is clear. Uvula midline. Postnasal drip present. No pharyngeal swelling, oropharyngeal exudate, posterior oropharyngeal erythema or uvula swelling.     Tonsils: No tonsillar exudate. 0 on the right. 0 on the left.     Comments: Postnasal drip Eyes:     General: Lids are normal.        Right eye: No discharge.        Left eye: No discharge.     Conjunctiva/sclera: Conjunctivae normal.     Right eye: Right conjunctiva is not injected.     Left eye: Left conjunctiva is not injected.  Neck:     Trachea: Trachea and phonation normal.  Cardiovascular:     Rate and Rhythm: Normal rate and regular rhythm.  Pulmonary:     Effort: Pulmonary effort is normal.     Breath sounds: Normal breath sounds.  Chest:     Chest wall: No tenderness.  Musculoskeletal:        General: Normal range of motion.     Cervical back: Full passive range of motion without pain, normal range of motion and neck supple. Normal range of motion.  Lymphadenopathy:     Cervical: No cervical adenopathy.  Skin:    General: Skin is warm and dry.     Findings: No erythema or rash.  Neurological:     General: No focal deficit present.     Mental Status: She is alert and oriented to person, place, and time. Mental status is at baseline.  Psychiatric:        Attention and Perception: Attention and perception normal.        Mood and Affect: Mood and affect normal.        Speech: Speech normal.        Behavior: Behavior normal. Behavior is cooperative.  Thought Content: Thought content normal.     Visual Acuity Right  Eye Distance:   Left Eye Distance:   Bilateral Distance:    Right Eye Near:   Left Eye Near:    Bilateral Near:     UC Couse / Diagnostics / Procedures:     Radiology No results found.  Procedures Procedures (including critical care time) EKG  Pending results:  Labs Reviewed - No data to display  Medications Ordered in UC: Medications - No data to display  UC Diagnoses / Final Clinical Impressions(s)   I have reviewed the triage vital signs and the nursing notes.  Pertinent labs & imaging results that were available during my care of the patient were reviewed by me and considered in my medical decision making (see chart for details).    Final diagnoses:  Seasonal allergic rhinitis, unspecified trigger  Post-nasal drip   Physical exam findings concerning for uncontrolled seasonal allergies.  Patient advised.  Patient also advised that lungs are completely clear on auscultation, x-ray is not located at this time.  Patient was encouraged to resume allergy medication, Clarinex appears to be covered by her insurance whereas Loreli is not.  Patient also provided with a nasal steroid spray, Nasonex.  Close follow-up with PCP recommended.  Please see discharge instructions below for details of plan of care as provided to patient. ED Prescriptions     Medication Sig Dispense Auth. Provider   fexofenadine (ALLEGRA) 180 MG tablet  (Status: Discontinued) Take 1 tablet (180 mg total) by mouth daily. 90 tablet Joesph Shaver Scales, PA-C   desloratadine (CLARINEX) 5 MG tablet  (Status: Discontinued) Take 1 tablet (5 mg total) by mouth daily. 30 tablet Joesph Shaver Scales, PA-C   mometasone (NASONEX) 50 MCG/ACT nasal spray  (Status: Discontinued) Place 2 sprays into the nose daily. 1 each Joesph Shaver Scales, PA-C   desloratadine (CLARINEX) 5 MG tablet Take 1 tablet (5 mg total) by mouth daily. 30 tablet Joesph Shaver Scales, PA-C   fexofenadine (ALLEGRA) 180 MG tablet Take 1  tablet (180 mg total) by mouth daily. 90 tablet Joesph Shaver Scales, PA-C   mometasone (NASONEX) 50 MCG/ACT nasal spray Place 2 sprays into the nose daily. 1 each Joesph Shaver Scales, PA-C      PDMP not reviewed this encounter.  Pending results:  Labs Reviewed - No data to display    Discharge Instructions      Your symptoms and my physical exam findings are concerning for exacerbation of your underlying allergies.                   Please read below to learn more about the medications, dosages and frequencies that I recommend to help alleviate your symptoms and to get you feeling better soon:                      Clarinex (loratadine): This is an excellent second-generation antihistamine that helps to reduce respiratory inflammatory response to environmental allergens.  This medication is not known to cause daytime sleepiness so it can be taken in the daytime.  If you find that it does make you sleepy, please feel free to take it at bedtime.                               Nasonex (mometasone): This is a steroid nasal spray that used once daily, 1 spray in each nare.  This works best when used on a daily basis. This medication does not work well if it is only used when you think you need it.  After 3 to 5 days of use, you will notice significant reduction of the inflammation and mucus production that is currently being caused by exposure to allergens, whether seasonal or environmental.  The most common side effect of this medication is nosebleeds.  If you experience a nosebleed, please discontinue use for 1 week, then feel free to resume.   If you find that your insurance will not pay for this medication, please consider a different nasal steroids such as Flonase (fluticasone), or Nasacort (triamcinolone).        If symptoms have not meaningfully improved in the next 5 to 7 days, please return for repeat evaluation or follow-up with your regular provider.  If symptoms have worsened in the  next 3 to 5 days, please return for repeat evaluation or follow-up with your regular provider.        Thank you for visiting [facility] today.  We appreciate the opportunity to participate in your care.       Disposition Upon Discharge:  Condition: stable for discharge home  Patient presented with an acute illness with associated systemic symptoms and significant discomfort requiring urgent management. In my opinion, this is a condition that a prudent lay person (someone who possesses an average knowledge of health and medicine) may potentially expect to result in complications if not addressed urgently such as respiratory distress, impairment of bodily function or dysfunction of bodily organs.   Routine symptom specific, illness specific and/or disease specific instructions were discussed with the patient and/or caregiver at length.   As such, the patient has been evaluated and assessed, work-up was performed and treatment was provided in alignment with urgent care protocols and evidence based medicine.  Patient/parent/caregiver has been advised that the patient may require follow up for further testing and treatment if the symptoms continue in spite of treatment, as clinically indicated and appropriate.  Patient/parent/caregiver has been advised to return to the [facility] or PCP if no better; to PCP or the Emergency Department if new signs and symptoms develop, or if the current signs or symptoms continue to change or worsen for further workup, evaluation and treatment as clinically indicated and appropriate  The patient will follow up with their current PCP if and as advised. If the patient does not currently have a PCP we will assist them in obtaining one.   The patient may need specialty follow up if the symptoms continue, in spite of conservative treatment and management, for further workup, evaluation, consultation and treatment as clinically indicated and  appropriate.  Patient/parent/caregiver verbalized understanding and agreement of plan as discussed.  All questions were addressed during visit.  Please see discharge instructions below for further details of plan.  This office note has been dictated using Teaching laboratory technician.  Unfortunately, this method of dictation can sometimes lead to typographical or grammatical errors.  I apologize for your inconvenience in advance if this occurs.  Please do not hesitate to reach out to me if clarification is needed.      Joesph Shaver Scales, PA-C 08/26/24 1327

## 2024-08-26 NOTE — Discharge Instructions (Signed)
 Your symptoms and my physical exam findings are concerning for exacerbation of your underlying allergies.                   Please read below to learn more about the medications, dosages and frequencies that I recommend to help alleviate your symptoms and to get you feeling better soon:                      Clarinex (loratadine): This is an excellent second-generation antihistamine that helps to reduce respiratory inflammatory response to environmental allergens.  This medication is not known to cause daytime sleepiness so it can be taken in the daytime.  If you find that it does make you sleepy, please feel free to take it at bedtime.                               Nasonex (mometasone): This is a steroid nasal spray that used once daily, 1 spray in each nare.  This works best when used on a daily basis. This medication does not work well if it is only used when you think you need it.  After 3 to 5 days of use, you will notice significant reduction of the inflammation and mucus production that is currently being caused by exposure to allergens, whether seasonal or environmental.  The most common side effect of this medication is nosebleeds.  If you experience a nosebleed, please discontinue use for 1 week, then feel free to resume.   If you find that your insurance will not pay for this medication, please consider a different nasal steroids such as Flonase (fluticasone), or Nasacort (triamcinolone).        If symptoms have not meaningfully improved in the next 5 to 7 days, please return for repeat evaluation or follow-up with your regular provider.  If symptoms have worsened in the next 3 to 5 days, please return for repeat evaluation or follow-up with your regular provider.        Thank you for visiting [facility] today.  We appreciate the opportunity to participate in your care.

## 2024-08-26 NOTE — ED Triage Notes (Signed)
 For about two weeks she has had nasal congestion, nasal drainage, and fatigue. She states she had left mid back discomfort. 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 She had pneumonia a year ago as well.   Interventions: Saline nasal rinses
